# Patient Record
Sex: Female | Born: 2000 | Race: Black or African American | Hispanic: No | Marital: Single | Smoking: Never smoker
Health system: Southern US, Community
[De-identification: ages and names within clinical notes are randomized; demographics above are authoritative.]

---
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
## 2022-08-21 ENCOUNTER — Ambulatory Visit
Admission: EM | Admit: 2022-08-21 | Discharge: 2022-08-21 | Disposition: A | Discharge status: Home / Self Care | Payer: Medicaid Other

## 2022-08-21 DIAGNOSIS — S61011A Laceration without foreign body of right thumb without damage to nail, initial encounter: Secondary | ICD-10-CM | POA: Diagnosis not present

## 2022-08-21 NOTE — ED Triage Notes (Signed)
Patient presents to UC for laceration to right thumb 2 days ago. States she cut her thumb trying to open a crab leg. Tried using dermaplastn for pain. Does not know when she received her last Tdap.

## 2022-08-21 NOTE — ED Provider Notes (Signed)
[facility]    CSN: [id number] Arrival date & time: 08/21/22  1842      History   Chief Complaint Chief Complaint  Patient presents with   Laceration    HPI Margaret Petty is a 22 y.o. female.   Patient presents today for evaluation of laceration that occurred approximately 2 days ago.  She reports that she was cutting crab when the knife slipped and cut her right finger.  She has cleaned this with alcohol and hydrogen peroxide.  She has not applied any topical medication.  Denies any recent antibiotics.  She is unsure when her last tetanus was.  She is right-handed.  She reports pain is rated 6 on a 0-10 pain scale, described as throbbing, worse with manipulation of the area, no alleviating factors identified.  She reports that she has had occasional bleeding from the area as the wound reopens.    History reviewed. No pertinent past medical history.  There are no problems to display for this patient.   History reviewed. No pertinent surgical history.  OB History   No obstetric history on file.      Home Medications    Prior to Admission medications   Medication Sig Start Date End Date Taking? Authorizing Provider  albuterol (VENTOLIN HFA) 108 (90 Base) MCG/ACT inhaler Inhale into the lungs. 04/03/22  Yes [provider]  busPIRone (BUSPAR) 5 MG tablet Take by mouth. 08/16/22 11/14/22 Yes [provider]  cetirizine (ZYRTEC) 10 MG tablet Take by mouth. 03/06/22  Yes [provider]  venlafaxine XR (EFFEXOR-XR) 150 MG 24 hr capsule Take 150 mg by mouth daily.    [provider]    Family History History reviewed. No pertinent family history.  Social History Social History   Tobacco Use   Smoking status: Never   Smokeless tobacco: Never  Vaping Use   Vaping Use: Every day  Substance Use Topics   Alcohol use: Yes    Comment: social drinker     Allergies   Lactose and Latex   Review of Systems Review of  Systems  Constitutional:  Positive for activity change. Negative for appetite change, fatigue and fever.  Musculoskeletal:  Positive for myalgias. Negative for arthralgias.  Skin:  Positive for wound.  Neurological:  Negative for weakness and numbness.     Physical Exam Triage Vital Signs ED Triage Vitals  Enc Vitals Group     BP 08/21/22 1908 (!) 149/95     Pulse Rate 08/21/22 1908 84     Resp 08/21/22 1908 18     Temp 08/21/22 1908 98.1 F (36.7 C)     Temp Source 08/21/22 1908 Oral     SpO2 08/21/22 1908 100 %     Weight --      Height --      Head Circumference --      Peak Flow --      Pain Score 08/21/22 1904 6     Pain Loc --      Pain Edu? --      Excl. in [place]? --    No data found.  Updated Vital Signs BP (!) 149/95 (BP Location: Left Arm)   Pulse 84   Temp 98.1 F (36.7 C) (Oral)   Resp 18   SpO2 100%   Visual Acuity Right Eye Distance:   Left Eye Distance:   Bilateral Distance:    Right Eye Near:   Left Eye Near:    Bilateral  Near:     Physical Exam Vitals reviewed.  Constitutional:      General: She is awake. She is not in acute distress.    Appearance: Normal appearance. She is well-developed. She is not ill-appearing.     Comments: Very pleasant female appears stated age in no acute distress sitting comfortably in exam room  HENT:     Head: Normocephalic and atraumatic.  Cardiovascular:     Rate and Rhythm: Normal rate and regular rhythm.     Heart sounds: Normal heart sounds, S1 normal and S2 normal. No murmur heard.    Comments: Capillary refill within 2 seconds right thumb Pulmonary:     Effort: Pulmonary effort is normal.     Breath sounds: Normal breath sounds. No wheezing, rhonchi or rales.     Comments: Clear to auscultation bilaterally Musculoskeletal:     Right hand: Laceration present. No swelling. Normal range of motion. Normal strength. Normal sensation. There is no disruption of two-point discrimination.     Comments: 2.5 cm  curved laceration noted right distal thumb.  No active bleeding or drainage noted.  Hand is neurovascularly intact.  Normal to point discrimination.  Normal pincer grip strength.  Psychiatric:        Behavior: Behavior is cooperative.      UC Treatments / Results  Labs (all labs ordered are listed, but only abnormal results are displayed) Labs Reviewed - No data to display  EKG   Radiology No results found.  Procedures Procedures (including critical care time)  Medications Ordered in UC Medications - No data to display  Initial Impression / Assessment and Plan / UC Course  I have reviewed the triage vital signs and the nursing notes.  Pertinent labs & imaging results that were available during my care of the patient were reviewed by me and considered in my medical decision making (see chart for details).     Patient is well-appearing, afebrile, nontoxic, nontachycardic.  Hand is neurovascularly intact.  Wound is 48 hours old and appears to be healing appropriately.  Discussed that we typically would not attempt to close this wound and allow it to heal on its own.  She was encouraged to keep this area clean with soap and water and stop using hydrogen peroxide and alcohol.  She has mupirocin available and was encouraged to use this with dressing changes.  She can use over-the-counter medication for symptom management.  Review of chart indicates that she had a tetanus on 11/01/2017 at [facility] so update was deferred.  Discussed that if she has any changing or worsening symptoms she needs to be reevaluated.  All questions answered to patient satisfaction.  Final Clinical Impressions(s) / UC Diagnoses   Final diagnoses:  Laceration of right thumb without foreign body without damage to nail, initial encounter   Discharge Instructions   None    ED Prescriptions   None    PDMP not reviewed this encounter.   Terrilee Croak, PA-C 08/21/22 1927
